# Patient Record
Sex: Female | Born: 1965 | Race: White | Hispanic: No | Marital: Married | Smoking: Never smoker
Health system: Southern US, Community
[De-identification: ages and names within clinical notes are randomized; demographics above are authoritative.]

## PROBLEM LIST (undated history)

## (undated) DIAGNOSIS — I10 Essential (primary) hypertension: Secondary | ICD-10-CM

---

## 2015-09-23 ENCOUNTER — Emergency Department (HOSPITAL_BASED_OUTPATIENT_CLINIC_OR_DEPARTMENT_OTHER)
Admission: EM | Admit: 2015-09-23 | Discharge: 2015-09-23 | Disposition: A | Discharge status: Home / Self Care | Payer: BLUE CROSS/BLUE SHIELD | Attending: Emergency Medicine | Admitting: Emergency Medicine

## 2015-09-23 ENCOUNTER — Encounter (HOSPITAL_BASED_OUTPATIENT_CLINIC_OR_DEPARTMENT_OTHER): Payer: Self-pay | Admitting: *Deleted

## 2015-09-23 ENCOUNTER — Emergency Department (HOSPITAL_BASED_OUTPATIENT_CLINIC_OR_DEPARTMENT_OTHER): Payer: BLUE CROSS/BLUE SHIELD

## 2015-09-23 DIAGNOSIS — I10 Essential (primary) hypertension: Secondary | ICD-10-CM | POA: Diagnosis not present

## 2015-09-23 DIAGNOSIS — R109 Unspecified abdominal pain: Secondary | ICD-10-CM | POA: Diagnosis present

## 2015-09-23 DIAGNOSIS — N23 Unspecified renal colic: Secondary | ICD-10-CM | POA: Insufficient documentation

## 2015-09-23 DIAGNOSIS — Z79899 Other long term (current) drug therapy: Secondary | ICD-10-CM | POA: Insufficient documentation

## 2015-09-23 HISTORY — DX: Essential (primary) hypertension: I10

## 2015-09-23 LAB — URINE MICROSCOPIC-ADD ON

## 2015-09-23 LAB — COMPREHENSIVE METABOLIC PANEL
ALK PHOS: 81 U/L (ref 38–126)
ALT: 16 U/L (ref 14–54)
AST: 18 U/L (ref 15–41)
Albumin: 3.9 g/dL (ref 3.5–5.0)
Anion gap: 6 (ref 5–15)
BILIRUBIN TOTAL: 0.7 mg/dL (ref 0.3–1.2)
BUN: 21 mg/dL — AB (ref 6–20)
CHLORIDE: 108 mmol/L (ref 101–111)
CO2: 21 mmol/L — AB (ref 22–32)
CREATININE: 0.83 mg/dL (ref 0.44–1.00)
Calcium: 8.8 mg/dL — ABNORMAL LOW (ref 8.9–10.3)
GFR calc Af Amer: >60 mL/min (ref >60)
GFR calc non Af Amer: >60 mL/min (ref >60)
Glucose, Bld: 128 mg/dL — ABNORMAL HIGH (ref 65–99)
Potassium: 3.9 mmol/L (ref 3.5–5.1)
SODIUM: 135 mmol/L (ref 135–145)
Total Protein: 7.2 g/dL (ref 6.5–8.1)

## 2015-09-23 LAB — URINALYSIS, ROUTINE W REFLEX MICROSCOPIC
GLUCOSE, UA: NEGATIVE mg/dL
Ketones, ur: NEGATIVE mg/dL
Leukocytes, UA: NEGATIVE
Nitrite: NEGATIVE
PH: 5.5 (ref 5.0–8.0)
PROTEIN: 30 mg/dL — AB
Specific Gravity, Urine: 1.03 (ref 1.005–1.030)

## 2015-09-23 LAB — CBC WITH DIFFERENTIAL/PLATELET
Basophils Absolute: 0 10*3/uL (ref 0.0–0.1)
Basophils Relative: 0 %
EOS ABS: 0.1 10*3/uL (ref 0.0–0.7)
EOS PCT: 1 %
HCT: 34.1 % — ABNORMAL LOW (ref 36.0–46.0)
Hemoglobin: 10.7 g/dL — ABNORMAL LOW (ref 12.0–15.0)
LYMPHS ABS: 1.4 10*3/uL (ref 0.7–4.0)
Lymphocytes Relative: 22 %
MCH: 27.2 pg (ref 26.0–34.0)
MCHC: 31.4 g/dL (ref 30.0–36.0)
MCV: 86.5 fL (ref 78.0–100.0)
MONOS PCT: 6 %
Monocytes Absolute: 0.4 10*3/uL (ref 0.1–1.0)
Neutro Abs: 4.4 10*3/uL (ref 1.7–7.7)
Neutrophils Relative %: 71 %
PLATELETS: 222 10*3/uL (ref 150–400)
RBC: 3.94 MIL/uL (ref 3.87–5.11)
RDW: 15 % (ref 11.5–15.5)
WBC: 6.3 10*3/uL (ref 4.0–10.5)

## 2015-09-23 LAB — PREGNANCY, URINE: PREG TEST UR: NEGATIVE

## 2015-09-23 MED ORDER — KETOROLAC TROMETHAMINE 15 MG/ML IJ SOLN
15.0000 mg | Freq: Once | INTRAMUSCULAR | Status: AC
  Administered 2015-09-23: 15 mg via INTRAVENOUS
  Filled 2015-09-23: qty 1

## 2015-09-23 MED ORDER — TAMSULOSIN HCL 0.4 MG PO CAPS: 0.4000 mg | ORAL_CAPSULE | Freq: Every day | ORAL | Status: AC

## 2015-09-23 MED ORDER — KETOROLAC TROMETHAMINE 30 MG/ML IJ SOLN
INTRAMUSCULAR | Status: AC
  Filled 2015-09-23: qty 1

## 2015-09-23 MED ORDER — FENTANYL CITRATE (PF) 100 MCG/2ML IJ SOLN
50.0000 ug | INTRAMUSCULAR | Status: AC | PRN
  Administered 2015-09-23 (×2): 50 ug via INTRAVENOUS
  Filled 2015-09-23: qty 2

## 2015-09-23 MED ORDER — FENTANYL CITRATE (PF) 100 MCG/2ML IJ SOLN
INTRAMUSCULAR | Status: AC
Start: 2015-09-23 — End: 2015-09-23
  Administered 2015-09-23: 50 ug via INTRAVENOUS
  Filled 2015-09-23: qty 2

## 2015-09-23 MED ORDER — SODIUM CHLORIDE 0.9 % IV BOLUS (SEPSIS)
1000.0000 mL | Freq: Once | INTRAVENOUS | Status: AC
  Administered 2015-09-23: 1000 mL via INTRAVENOUS

## 2015-09-23 MED ORDER — ONDANSETRON HCL 4 MG/2ML IJ SOLN
4.0000 mg | Freq: Once | INTRAMUSCULAR | Status: AC
  Administered 2015-09-23: 4 mg via INTRAVENOUS
  Filled 2015-09-23: qty 2

## 2015-09-23 MED ORDER — ONDANSETRON HCL 4 MG PO TABS: 4.0000 mg | ORAL_TABLET | Freq: Four times a day (QID) | ORAL | Status: AC

## 2015-09-23 MED ORDER — OXYCODONE-ACETAMINOPHEN 5-325 MG PO TABS: 1.0000 | ORAL_TABLET | Freq: Four times a day (QID) | ORAL | Status: AC | PRN

## 2015-09-23 NOTE — ED Notes (Signed)
Pt c/o left flank sudden onset of pain with n/v.

## 2015-09-23 NOTE — ED Provider Notes (Signed)
CSN: [id number]     Arrival date & time 09/23/15  2005 History   First MD Initiated Contact with Patient 09/23/15 2108     Chief Complaint  Patient presents with  . Flank Pain     Patient is a 50 y.o. female presenting with flank pain. The history is provided by the patient. No language interpreter was used.  Flank Pain   Deborah Short is a 50 y.o. female who presents to the Emergency Department complaining of flank pain.  At 655 this evening she developed sudden onset left flank pain that radiates to her left groin. Symptoms woke her from sleep. She has associated nausea and vomiting. No fevers, dysuria, hematuria, diarrhea. She works as a Therapist, music. She has similar symptoms 18 years ago with prior kidney stone. Symptoms are severe, constant, worsening.  Past Medical History  Diagnosis Date  . Hypertension    History reviewed. No pertinent past surgical history. History reviewed. No pertinent family history. Social History  Substance Use Topics  . Smoking status: Never Smoker   . Smokeless tobacco: None  . Alcohol Use: No   OB History    No data available     Review of Systems  Genitourinary: Positive for flank pain.  All other systems reviewed and are negative.     Allergies  Penicillins and Septra  Home Medications   Prior to Admission medications   Medication Sig Start Date End Date Taking? Authorizing Provider  nebivolol (BYSTOLIC) 5 MG tablet Take 5 mg by mouth daily.   Yes Historical Provider, MD  ondansetron (ZOFRAN) 4 MG tablet Take 1 tablet (4 mg total) by mouth every 6 (six) hours. 09/23/15   Deborah Fossa, MD  oxyCODONE-acetaminophen (PERCOCET/ROXICET) 5-325 MG tablet Take 1 tablet by mouth every 6 (six) hours as needed for severe pain. 09/23/15   Deborah Fossa, MD  tamsulosin (FLOMAX) 0.4 MG CAPS capsule Take 1 capsule (0.4 mg total) by mouth daily. 09/23/15   Deborah Fossa, MD   BP 128/80 mmHg  Pulse 73  Temp(Src) 98.7 F (37.1 C) (Oral)  Resp 18   Ht  (1.575 m)  Wt 180 lb (81.647 kg)  BMI 32.91 kg/m2  SpO2 99%  LMP 09/02/2015 Physical Exam  Constitutional: She is oriented to person, place, and time. She appears well-developed and well-nourished.  Uncomfortable appearing  HENT:  Head: Normocephalic and atraumatic.  Cardiovascular: Normal rate and regular rhythm.   No murmur heard. Pulmonary/Chest: Effort normal and breath sounds normal. No respiratory distress.  Abdominal: Soft. There is no rebound and no guarding.  Left CVA tenderness  Musculoskeletal: She exhibits no edema or tenderness.  Neurological: She is alert and oriented to person, place, and time.  Skin: Skin is warm and dry.  Psychiatric: She has a normal mood and affect. Her behavior is normal.  Nursing note and vitals reviewed.   ED Course  Procedures (including critical care time) Labs Review Labs Reviewed  URINALYSIS, ROUTINE W REFLEX MICROSCOPIC (NOT AT [facility]) - Abnormal; Notable for the following:    APPearance CLOUDY (*)    Hgb urine dipstick LARGE (*)    Bilirubin Urine SMALL (*)    Protein, ur 30 (*)    All other components within normal limits  URINE MICROSCOPIC-ADD ON - Abnormal; Notable for the following:    Squamous Epithelial / LPF 0-5 (*)    Bacteria, UA MANY (*)    All other components within normal limits  COMPREHENSIVE METABOLIC PANEL - Abnormal; Notable for  the following:    CO2 21 (*)    Glucose, Bld 128 (*)    BUN 21 (*)    Calcium 8.8 (*)    All other components within normal limits  CBC WITH DIFFERENTIAL/PLATELET - Abnormal; Notable for the following:    Hemoglobin 10.7 (*)    HCT 34.1 (*)    All other components within normal limits  URINE CULTURE  PREGNANCY, URINE    Imaging Review Ct Renal Stone Study  09/23/2015  CLINICAL DATA:  Patient with left flank pain radiating to the front. Nausea. History renal stones. EXAM: CT ABDOMEN AND PELVIS WITHOUT CONTRAST TECHNIQUE: Multidetector CT imaging of the abdomen and pelvis  was performed following the standard protocol without IV contrast. COMPARISON:  None. FINDINGS: Lower chest: Normal heart size. Dependent atelectasis within the bilateral lower lobes. Hepatobiliary: The liver is diffusely low in attenuation compatible with steatosis. Gallbladder is unremarkable. Pancreas: Unremarkable Spleen: Unremarkable Adrenals/Urinary Tract: Normal adrenal glands. There is a 5 mm obstructing stone within the proximal left ureter (image 42; series 2) resulting in mild left hydronephrosis. Mild amount of left perinephric fat stranding. No definite nephrolithiasis. Urinary bladder is decompressed. Stomach/Bowel: Sigmoid colonic diverticulosis. No CT evidence for acute diverticulitis. The appendix is normal. No abnormal bowel wall thickening or evidence for bowel obstruction. No free fluid or free intraperitoneal air. Small hiatal hernia. Vascular/Lymphatic: Normal caliber abdominal aorta. No retroperitoneal lymphadenopathy. Other: Uterus is retroverted. Indeterminate 3.8 cm mass off the uterine body, likely representing a fibroid. There is a 4.3 cm cystic lesion within the left adnexa. Small amount of free fluid within the pelvis. Musculoskeletal: No aggressive or acute appearing osseous lesions. IMPRESSION: 5 mm obstructing stone within the proximal left ureter resulting in mild left hydronephrosis. Hepatic steatosis. There is a 4.3 cm cystic lesion within the left ovary. Recommend follow-up pelvic ultrasound in 6-8 weeks. Electronically Signed   By: Annia Beltrew  Davis M.D.   On: 09/23/2015 22:10   I have personally reviewed and evaluated these images and lab results as part of my medical decision-making.   EKG Interpretation None      MDM   Final diagnoses:  Renal colic on left side    Patient here for evaluation of flank pain. Her symptoms are significantly improved after medications in the emergency department. CT scan demonstrates a 5 mm proximal ureteral stone. UA is not consistent  with urinary tract infection, culture sent. Discussed home care for renal colic, outpatient follow-up, return precautions. Also discussed findings of ovarian cyst on the left and need for further evaluation as an outpatient.    Deborah FossaElizabeth Tykira Wachs, MD 09/23/15 2326

## 2015-09-23 NOTE — Discharge Instructions (Signed)
Your CT scan today demonstrated a 5 mm stone in your left ureter.  You also have a 4.3 cm cyst on your left ovary that will need to be further evaluated by pelvic ultrasound.  Get rechecked immediately if you develop fevers, uncontrolled pain, or new concerning symptoms.     Kidney Stones Kidney stones (urolithiasis) are deposits that form inside your kidneys. The intense pain is caused by the stone moving through the urinary tract. When the stone moves, the ureter goes into spasm around the stone. The stone is usually passed in the urine.  CAUSES   A disorder that makes certain neck glands produce too much parathyroid hormone (primary hyperparathyroidism).  A buildup of uric acid crystals, similar to gout in your joints.  Narrowing (stricture) of the ureter.  A kidney obstruction present at birth (congenital obstruction).  Previous surgery on the kidney or ureters.  Numerous kidney infections. SYMPTOMS   Feeling sick to your stomach (nauseous).  Throwing up (vomiting).  Blood in the urine (hematuria).  Pain that usually spreads (radiates) to the groin.  Frequency or urgency of urination. DIAGNOSIS   Taking a history and physical exam.  Blood or urine tests.  CT scan.  Occasionally, an examination of the inside of the urinary bladder (cystoscopy) is performed. TREATMENT   Observation.  Increasing your fluid intake.  Extracorporeal shock wave lithotripsy--This is a noninvasive procedure that uses shock waves to break up kidney stones.  Surgery may be needed if you have severe pain or persistent obstruction. There are various surgical procedures. Most of the procedures are performed with the use of small instruments. Only small incisions are needed to accommodate these instruments, so recovery time is minimized. The size, location, and chemical composition are all important variables that will determine the proper choice of action for you. Talk to your health care  provider to better understand your situation so that you will minimize the risk of injury to yourself and your kidney.  HOME CARE INSTRUCTIONS   Drink enough water and fluids to keep your urine clear or pale yellow. This will help you to pass the stone or stone fragments.  Strain all urine through the provided strainer. Keep all particulate matter and stones for your health care provider to see. The stone causing the pain may be as small as a grain of salt. It is very important to use the strainer each and every time you pass your urine. The collection of your stone will allow your health care provider to analyze it and verify that a stone has actually passed. The stone analysis will often identify what you can do to reduce the incidence of recurrences.  Only take over-the-counter or prescription medicines for pain, discomfort, or fever as directed by your health care provider.  Keep all follow-up visits as told by your health care provider. This is important.  Get follow-up X-rays if required. The absence of pain does not always mean that the stone has passed. It may have only stopped moving. If the urine remains completely obstructed, it can cause loss of kidney function or even complete destruction of the kidney. It is your responsibility to make sure X-rays and follow-ups are completed. Ultrasounds of the kidney can show blockages and the status of the kidney. Ultrasounds are not associated with any radiation and can be performed easily in a matter of minutes.  Make changes to your daily diet as told by your health care provider. You may be told to:  Limit the  amount of salt that you eat.  Eat 5 or more servings of fruits and vegetables each day.  Limit the amount of meat, poultry, fish, and eggs that you eat.  Collect a 24-hour urine sample as told by your health care provider.You may need to collect another urine sample every 6-12 months. SEEK MEDICAL CARE IF:  You experience pain that  is progressive and unresponsive to any pain medicine you have been prescribed. SEEK IMMEDIATE MEDICAL CARE IF:   Pain cannot be controlled with the prescribed medicine.  You have a fever or shaking chills.  The severity or intensity of pain increases over 18 hours and is not relieved by pain medicine.  You develop a new onset of abdominal pain.  You feel faint or pass out.  You are unable to urinate.   This information is not intended to replace advice given to you by your health care provider. Make sure you discuss any questions you have with your health care provider.   Document Released: 05/15/2005 Document Revised: 02/03/2015 Document Reviewed: 10/16/2012 Elsevier Interactive Patient Education [employer]2016 [employer]

## 2015-09-23 NOTE — ED Notes (Signed)
Patient requesting something for pain.

## 2015-09-25 LAB — URINE CULTURE

## 2016-11-07 IMAGING — CT CT RENAL STONE PROTOCOL
2 of 3 series · 16 of 46 positions shown, 18 images · non-contrast
Comparison: None.

CLINICAL DATA: Patient with left flank pain radiating to the front.
Nausea. History renal stones.

EXAM:
CT ABDOMEN AND PELVIS WITHOUT CONTRAST
TECHNIQUE: Multidetector CT imaging of the abdomen and pelvis was performed
following the standard protocol without IV contrast.

[Series 2: axial st · axial · 0.92mm/px · z∈[-486,-81]mm · 13 of 94 slices shown, 15 images]
[im 7/94  soft-tissue]
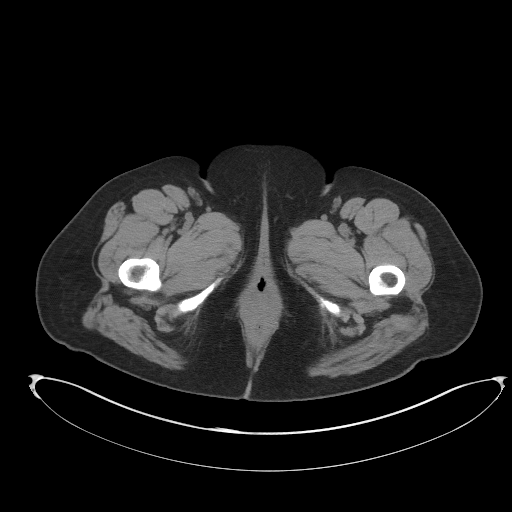
[im 7/94  bone]
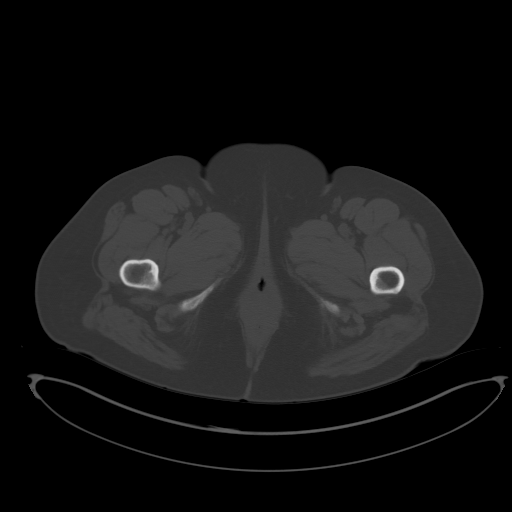
[im 13/94  soft-tissue]
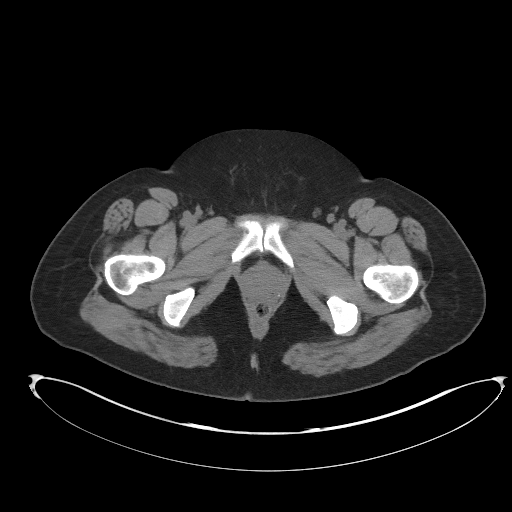
[im 19/94  soft-tissue]
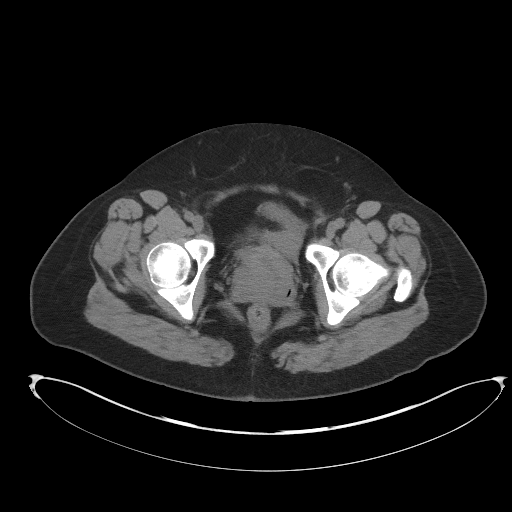
[im 28/94  soft-tissue]
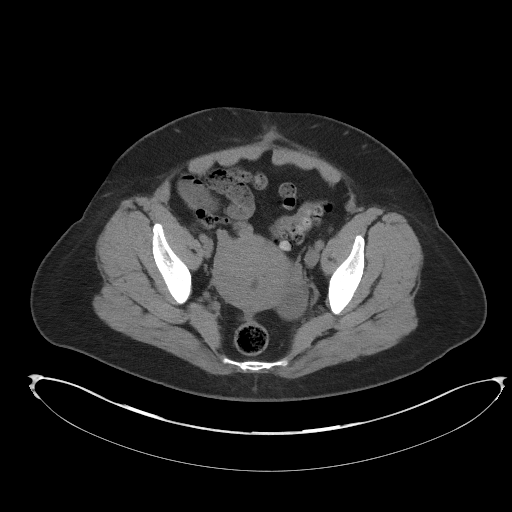
[im 34/94  soft-tissue]
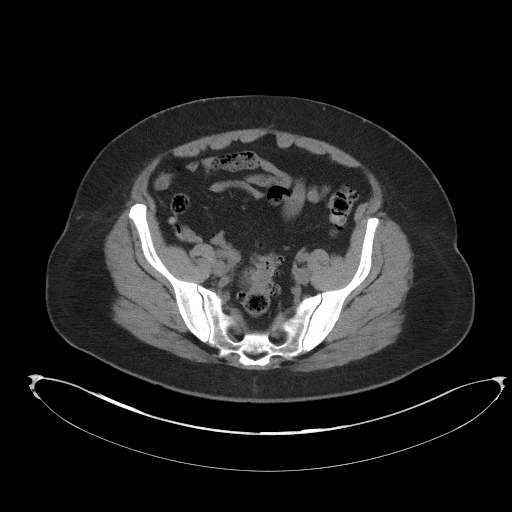
[im 40/94  soft-tissue]
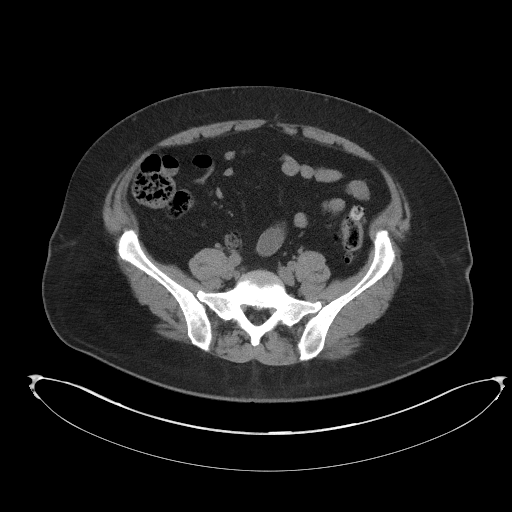
[im 49/94  soft-tissue]
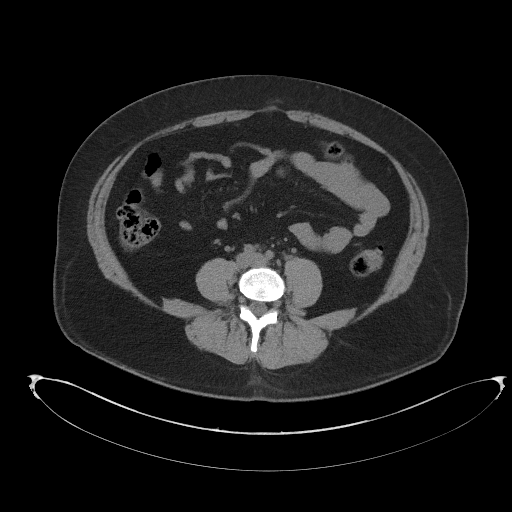
[im 55/94  soft-tissue]
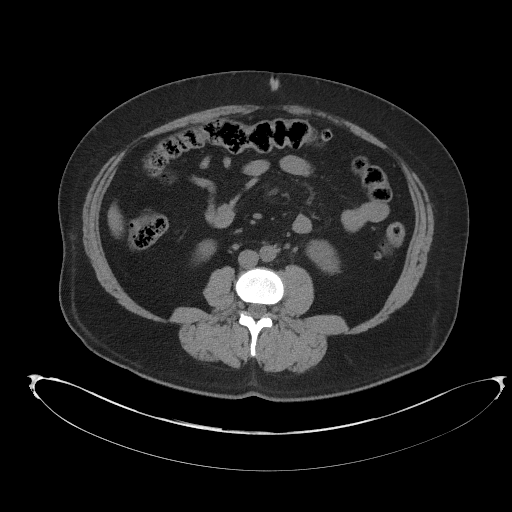
[im 61/94  soft-tissue]
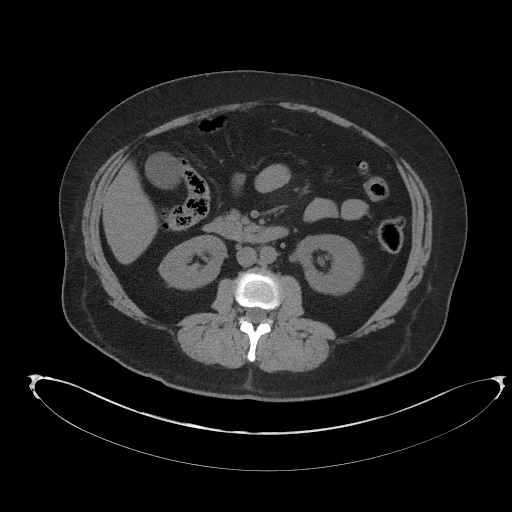
[im 61/94  bone]
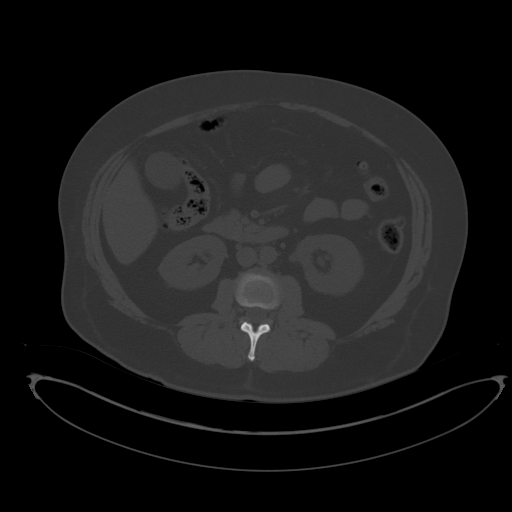
[im 67/94  soft-tissue]
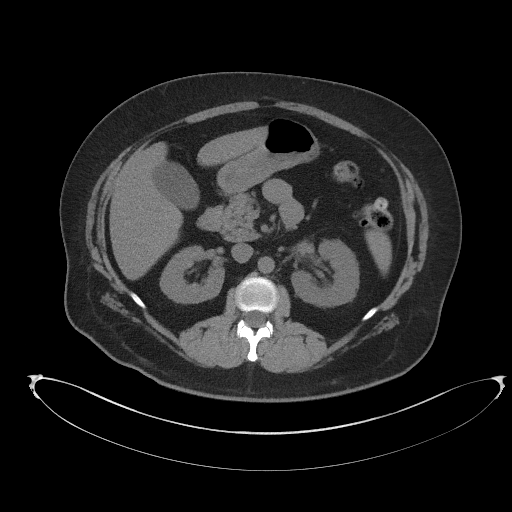
[im 76/94  soft-tissue]
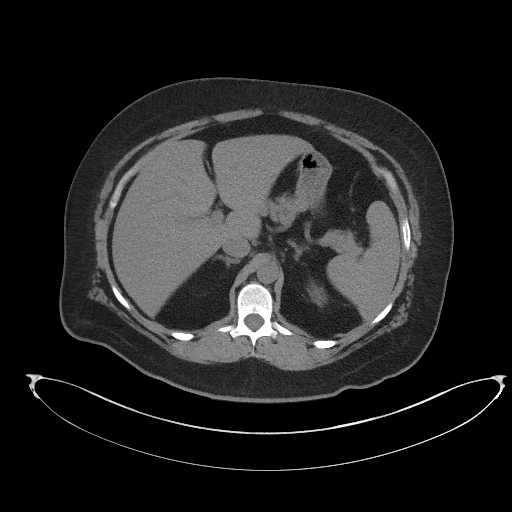
[im 82/94  soft-tissue]
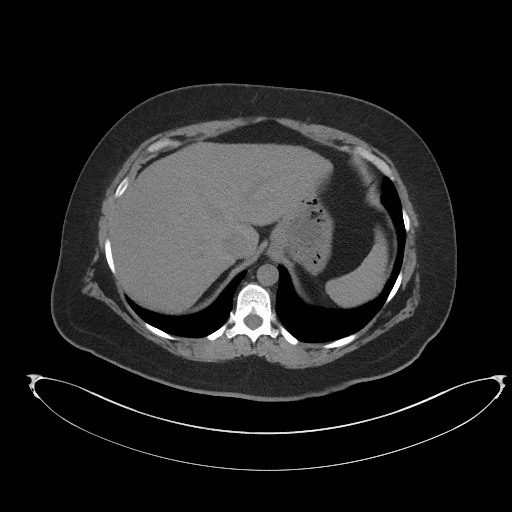
[im 88/94  soft-tissue]
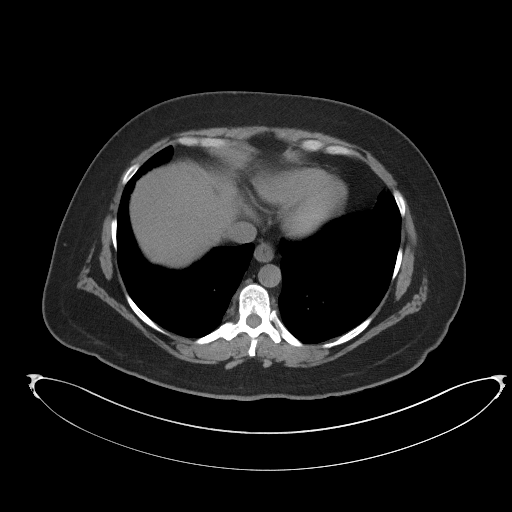

[Series 4: coronal st · coronal · 0.76mm/px · 3 of 93 slices shown]
[im 31/93  soft-tissue]
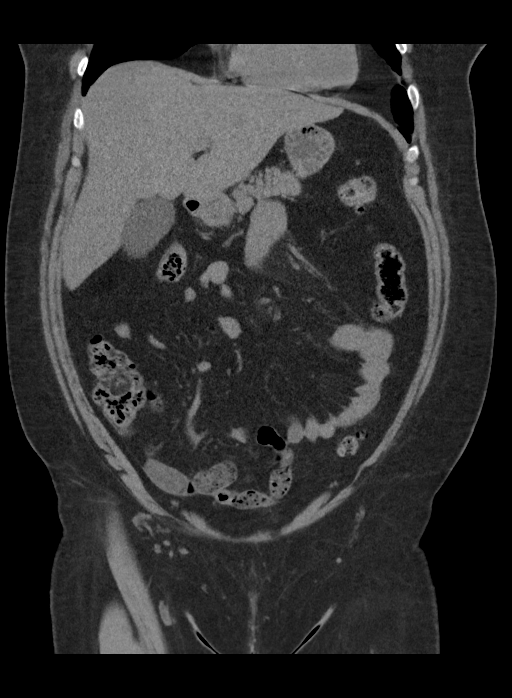
[im 41/93  soft-tissue]
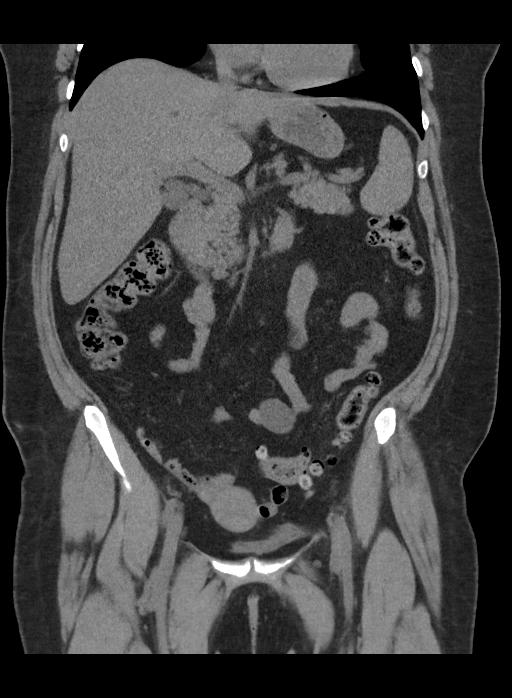
[im 52/93  soft-tissue]
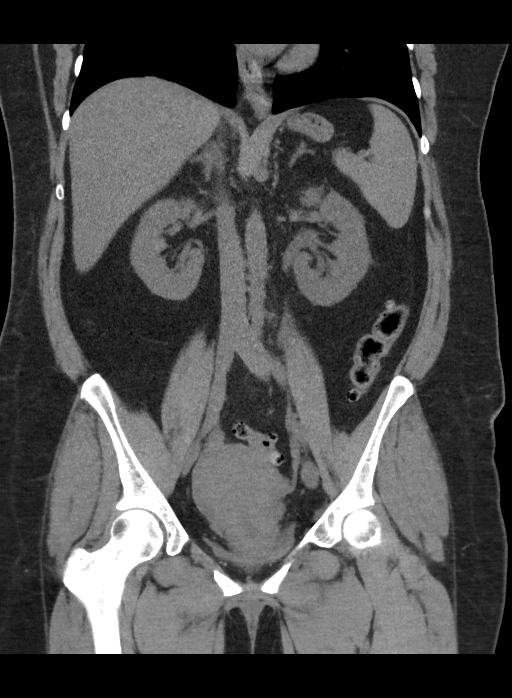

[16 of 46 positions shown; findings below may reference images not displayed]

FINDINGS: Lower chest: Normal heart size. Dependent atelectasis within the
bilateral lower lobes.

Hepatobiliary: The liver is diffusely low in attenuation compatible
with steatosis. Gallbladder is unremarkable.

Pancreas: Unremarkable

Spleen: Unremarkable

Adrenals/Urinary Tract: Normal adrenal glands. There is a 5 mm
obstructing stone within the proximal left ureter (image 42; series
2) resulting in mild left hydronephrosis. Mild amount of left
perinephric fat stranding. No definite nephrolithiasis. Urinary
bladder is decompressed.

Stomach/Bowel: Sigmoid colonic diverticulosis. No CT evidence for
acute diverticulitis. The appendix is normal. No abnormal bowel wall
thickening or evidence for bowel obstruction. No free fluid or free
intraperitoneal air. Small hiatal hernia.

Vascular/Lymphatic: Normal caliber abdominal aorta. No
retroperitoneal lymphadenopathy.

Other: Uterus is retroverted. Indeterminate 3.8 cm mass off the
uterine body, likely representing a fibroid. There is a 4.3 cm
cystic lesion within the left adnexa. Small amount of free fluid
within the pelvis.

Musculoskeletal: No aggressive or acute appearing osseous lesions.
IMPRESSION: 5 mm obstructing stone within the proximal left ureter resulting in
mild left hydronephrosis.

Hepatic steatosis.

There is a 4.3 cm cystic lesion within the left ovary. Recommend
follow-up pelvic ultrasound in 6-8 weeks.
